# Patient Record
Sex: Male | Born: 1990 | Race: White | Hispanic: No | Marital: Single | Smoking: Current every day smoker
Health system: Southern US, Community
[De-identification: ages and names within clinical notes are randomized; demographics above are authoritative.]

## PROBLEM LIST (undated history)

## (undated) DIAGNOSIS — F410 Panic disorder [episodic paroxysmal anxiety] without agoraphobia: Secondary | ICD-10-CM

## (undated) DIAGNOSIS — F419 Anxiety disorder, unspecified: Secondary | ICD-10-CM

## (undated) HISTORY — PX: HERNIA REPAIR: SHX51

---

## 2016-08-06 ENCOUNTER — Emergency Department (HOSPITAL_COMMUNITY)
Admission: EM | Admit: 2016-08-06 | Discharge: 2016-08-06 | Disposition: A | Discharge status: Home / Self Care | Payer: Managed Care, Other (non HMO) | Attending: Emergency Medicine | Admitting: Emergency Medicine

## 2016-08-06 ENCOUNTER — Emergency Department (HOSPITAL_COMMUNITY): Payer: Managed Care, Other (non HMO)

## 2016-08-06 ENCOUNTER — Encounter (HOSPITAL_COMMUNITY): Payer: Self-pay

## 2016-08-06 DIAGNOSIS — Y9351 Activity, roller skating (inline) and skateboarding: Secondary | ICD-10-CM | POA: Diagnosis not present

## 2016-08-06 DIAGNOSIS — F17200 Nicotine dependence, unspecified, uncomplicated: Secondary | ICD-10-CM | POA: Insufficient documentation

## 2016-08-06 DIAGNOSIS — S161XXA Strain of muscle, fascia and tendon at neck level, initial encounter: Secondary | ICD-10-CM | POA: Insufficient documentation

## 2016-08-06 DIAGNOSIS — Y999 Unspecified external cause status: Secondary | ICD-10-CM | POA: Insufficient documentation

## 2016-08-06 DIAGNOSIS — S199XXA Unspecified injury of neck, initial encounter: Secondary | ICD-10-CM | POA: Diagnosis present

## 2016-08-06 DIAGNOSIS — Y92480 Sidewalk as the place of occurrence of the external cause: Secondary | ICD-10-CM | POA: Insufficient documentation

## 2016-08-06 DIAGNOSIS — F419 Anxiety disorder, unspecified: Secondary | ICD-10-CM | POA: Diagnosis not present

## 2016-08-06 HISTORY — DX: Anxiety disorder, unspecified: F41.9

## 2016-08-06 HISTORY — DX: Panic disorder (episodic paroxysmal anxiety): F41.0

## 2016-08-06 MED ORDER — METHOCARBAMOL 500 MG PO TABS: 500.0000 mg | ORAL_TABLET | Freq: Two times a day (BID) | ORAL | 0 refills | Status: AC

## 2016-08-06 MED ORDER — METHOCARBAMOL 500 MG PO TABS
750.0000 mg | ORAL_TABLET | Freq: Once | ORAL | Status: AC
  Administered 2016-08-06: 750 mg via ORAL
  Filled 2016-08-06: qty 2

## 2016-08-06 MED ORDER — KETOROLAC TROMETHAMINE 60 MG/2ML IM SOLN
30.0000 mg | Freq: Once | INTRAMUSCULAR | Status: DC
  Filled 2016-08-06: qty 2

## 2016-08-06 MED ORDER — IBUPROFEN 600 MG PO TABS: 600.0000 mg | ORAL_TABLET | Freq: Four times a day (QID) | ORAL | 0 refills | Status: AC | PRN

## 2016-08-06 NOTE — ED Provider Notes (Signed)
[facility]-EMERGENCY DEPT Provider Note   CSN: [id number] Arrival date & time: 08/06/16  1303   By signing my name below, I, Benjamin Rose, attest that this documentation has been prepared under the direction and in the presence of Benjamin Rose Derks, PA-C Electronically Signed: Cynda AcresHailei Rose, Scribe. 08/06/16. 1:40 PM.  History   Chief Complaint Chief Complaint  Patient presents with  . Neck Pain  . Referral    HPI Comments: Benjamin Rose is a 25 y.o. male with a PMHx of anxiety, who presents to the Emergency Department complaining of sudden-onset, constant neck pain that began one week ago. Patient states he fell off a skate board onto the sidewalk and hit his head, he has had constant neck pain ever since. Denies headache. Patient states he has been taking ibuprofen (approxiamtely 6 today) with no relief in pain. Patient is also complaining of increased anxiety and panic attacks almost daily. He went to different emergency department. Scarring was given Ativan and Prozac and was told to follow-up. Patient states "Prozac makes me feel funny so stopped it." He is requesting refill on Ativan and requesting outpatient resources for follow-up for his anxiety. He denies SI/HI, numbness, and tingling. Patient has no other complaints at this time.   The history is provided by the patient. No language interpreter was used.    Past Medical History:  Diagnosis Date  . Anxiety   . Panic attack     There are no active problems to display for this patient.   Past Surgical History:  Procedure Laterality Date  . HERNIA REPAIR         Home Medications    Prior to Admission medications   Not on File    Family History History reviewed. No pertinent family history.  Social History Social History  Substance Use Topics  . Smoking status: Current Every Day Smoker  . Smokeless tobacco: Never Used  . Alcohol use No     Allergies   Penicillins   Review of Systems Review of Systems    Musculoskeletal: Positive for neck pain and neck stiffness.  Neurological: Negative for weakness, numbness and headaches.  Psychiatric/Behavioral: Positive for sleep disturbance. Negative for suicidal ideas. The patient is nervous/anxious.   All other systems reviewed and are negative.    Physical Exam Updated Vital Signs BP 136/83 (BP Location: Right Arm)   Pulse 100   Temp 98 F (36.7 C) (Oral)   Resp 16   SpO2 100%   Physical Exam  Constitutional: He appears well-developed and well-nourished. No distress.  HENT:  Head: Normocephalic and atraumatic.  Eyes: Conjunctivae are normal.  Neck: Neck supple.  Midline and bilateral paratubal cervical spine tenderness. Pain with range of motion and in direction of the neck. No deformity, step-offs noted on exam.  Cardiovascular: Normal rate, regular rhythm and normal heart sounds.   Pulmonary/Chest: Effort normal. No respiratory distress. He has no wheezes. He has no rales.  Abdominal: Soft. Bowel sounds are normal. He exhibits no distension. There is no tenderness. There is no rebound.  Musculoskeletal: He exhibits no edema.  Full range of motion of bilateral upper and lower extremities.  Neurological: He is alert.  5/5 and equal bilaeral upper and lower extremity strength. Grip strength 5/5 and equal. Sensation intact in upper and lower extremities.   Skin: Skin is warm and dry.  Multiple circular erythematous lesion to bilateral arms and neck, with central clearing, consistent with ringworm. Patient is already being treated for this.  Nursing note  and vitals reviewed.    ED Treatments / Results  DIAGNOSTIC STUDIES: Oxygen Saturation is 100% on RA, normal by my interpretation.    COORDINATION OF CARE: 1:48 PM Discussed treatment plan with pt at bedside and pt agreed to plan.  Labs (all labs ordered are listed, but only abnormal results are displayed) Labs Reviewed - No data to display  EKG  EKG Interpretation None        Radiology Dg Cervical Spine Complete  Result Date: 08/06/2016 CLINICAL DATA:  Skateboard injury last week. Fell with central neck pain. EXAM: CERVICAL SPINE - COMPLETE 4+ VIEW COMPARISON:  None. FINDINGS: There is no evidence of cervical spine fracture or prevertebral soft tissue swelling. Alignment is normal. No other significant bone abnormalities are identified. IMPRESSION: Normal Electronically Signed   By: Paulina FusiMark  Shogry M.D.   On: 08/06/2016 14:19    Procedures Procedures (including critical care time)  Medications Ordered in ED Medications - No data to display   Initial Impression / Assessment and Plan / ED Course  I have reviewed the triage vital signs and the nursing notes.  Pertinent labs & imaging results that were available during my care of the patient were reviewed by me and considered in my medical decision making (see chart for details).  Clinical Course    Patient in emergency department with persistent neck pain, one week after falling down in hitting his head. He is neurovascularly intact. No pain radiation from the neck. X-rays obtained and are negative. Patient is also complaining of anxiety, with panic attacks. He denies any suicidal or homicidal ideations at this time. I have provided him with a resource guide for follow-up for his anxiety and therapy. I will treat his neck pain with Robaxin and NSAIDs. Follow-up with primary care doctor. Return precautions discussed. Pt refused toradol that was ordered for his pain in ED .  Vitals:   08/06/16 1321  BP: 136/83  Pulse: 100  Resp: 16  Temp: 98 F (36.7 C)  TempSrc: Oral  SpO2: 100%      Final Clinical Impressions(s) / ED Diagnoses   Final diagnoses:  Acute strain of neck muscle, initial encounter  Anxiety    New Prescriptions Discharge Medication List as of 08/06/2016  2:37 PM    START taking these medications   Details  ibuprofen (ADVIL,MOTRIN) 600 MG tablet Take 1 tablet (600 mg total) by  mouth every 6 (six) hours as needed., Starting Wed 08/06/2016, Print    methocarbamol (ROBAXIN) 500 MG tablet Take 1 tablet (500 mg total) by mouth 2 (two) times daily., Starting Wed 08/06/2016, Print        I personally performed the services described in this documentation, which was scribed in my presence. The recorded information has been reviewed and is accurate.     Benjamin Crumbleatyana Janari Gagner, PA-C 08/06/16 1636    Cathren LaineKevin Steinl, MD 08/06/16 2041

## 2016-08-06 NOTE — ED Triage Notes (Signed)
Pt here for 2 things.  Pt states he has been told in past that he has scoliosis.  Pt fell off skateboard x 1 week ago and has had continued neck pain.  Pt also states he was seen a couple of weeks ago for anxiety and panic attacks while out of town.  Was prescribed prozac and ativan.  Pt stopped prozac after 2 weeks bc of how it made him feel.  Has used the minimal amount of ativan that was given. Wants referral to psychiatrist.  Denies SI/HI.

## 2016-08-06 NOTE — Discharge Instructions (Signed)
Ibuprofen for pain. Robaxin for spasms. Follow up with a family doctor. Follow up from resources provided for anxiety treatment.

## 2017-12-02 IMAGING — CR DG CERVICAL SPINE COMPLETE 4+V
6 series · 6 of 6 positions shown · non-contrast
Comparison: None.

CLINICAL DATA: Skateboard injury last week. Fell with central neck
pain.

EXAM:
CERVICAL SPINE - COMPLETE 4+ VIEW

[w cervical spine lat]
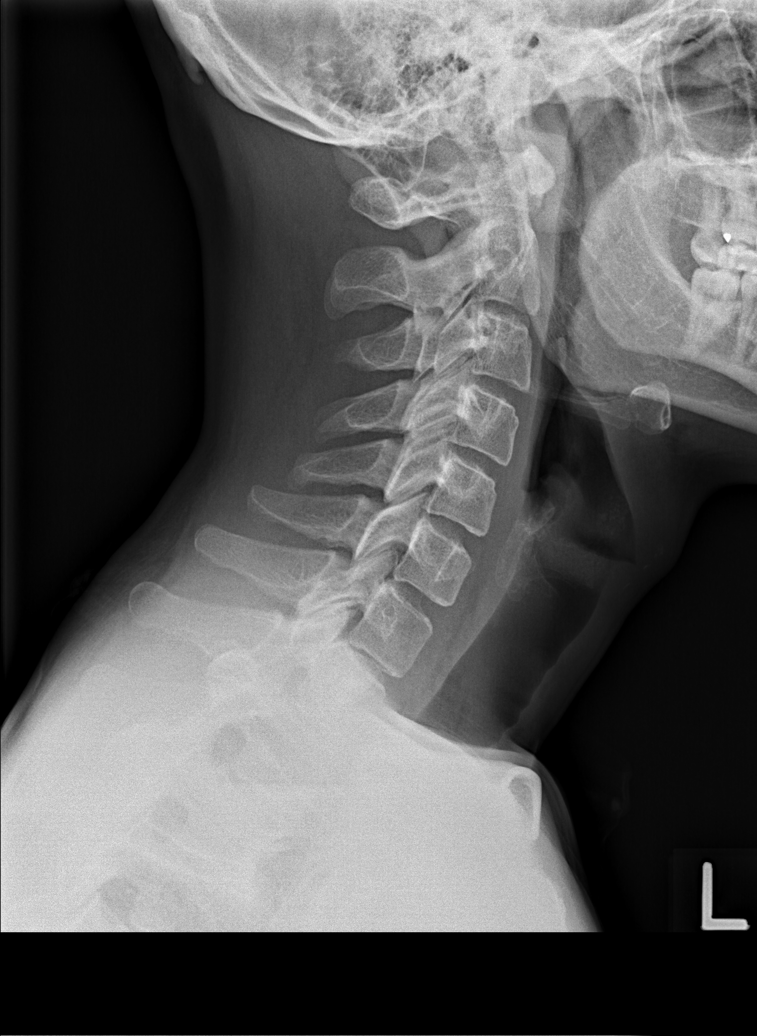

[w cervical spine ap_obl (1 of 2)]
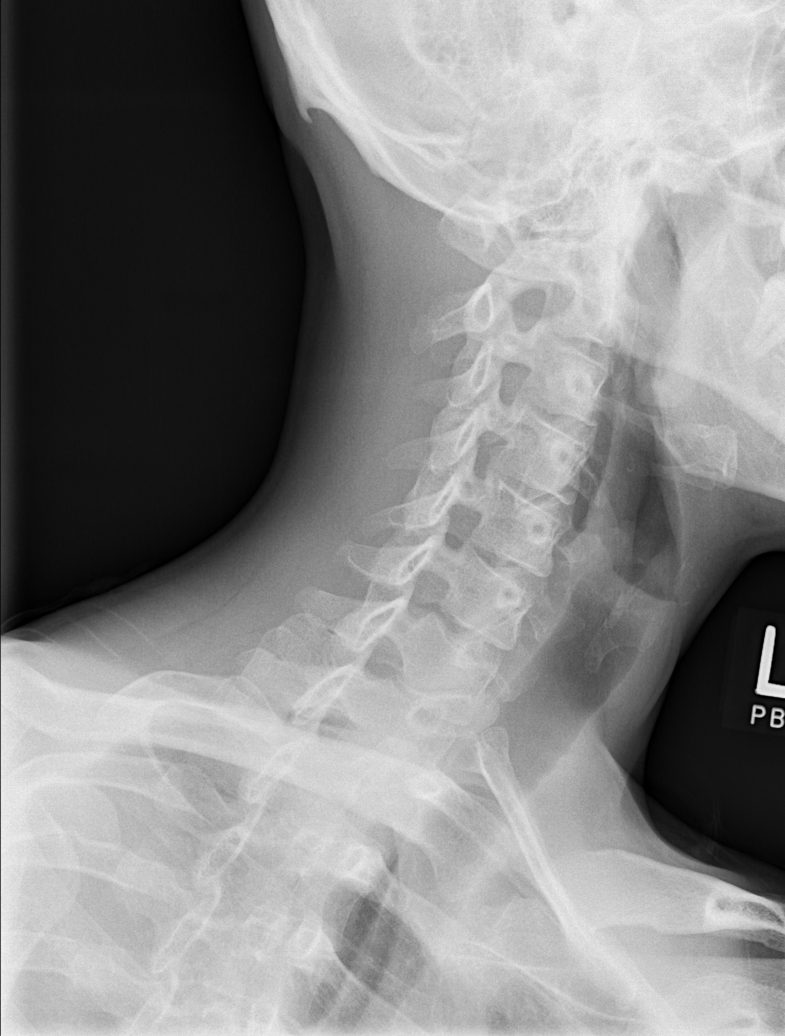

[w cervical spine ap_obl (2 of 2)]
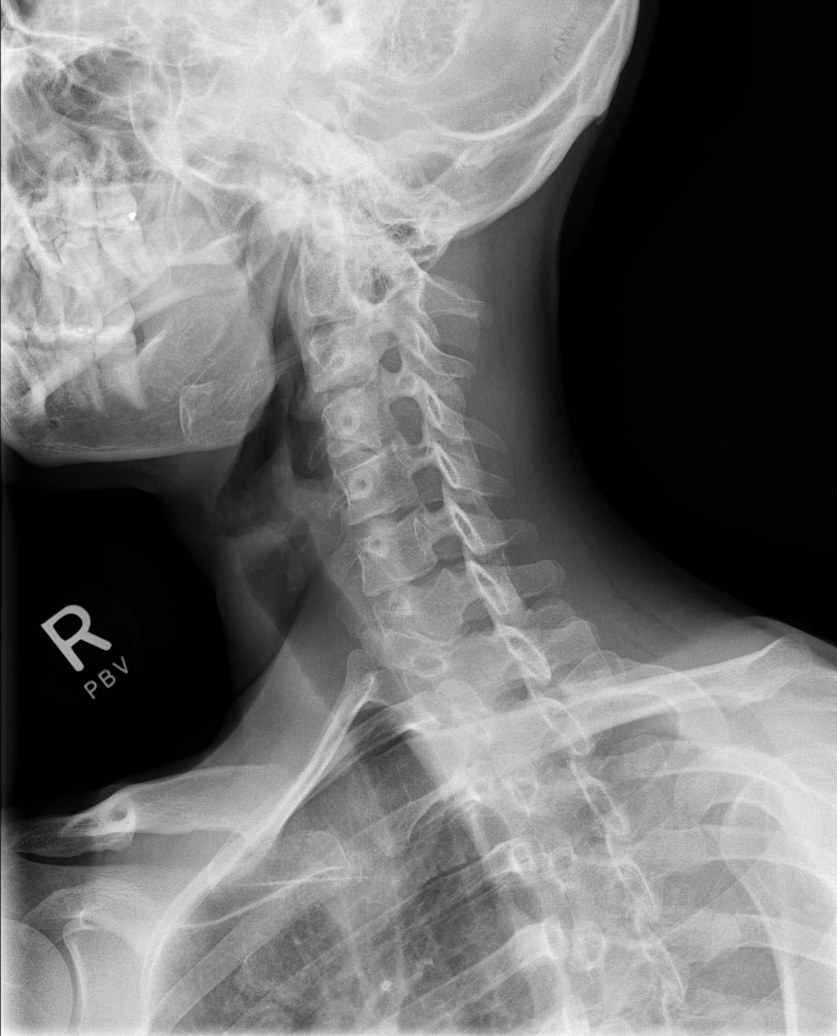

[w cervical spine ap]
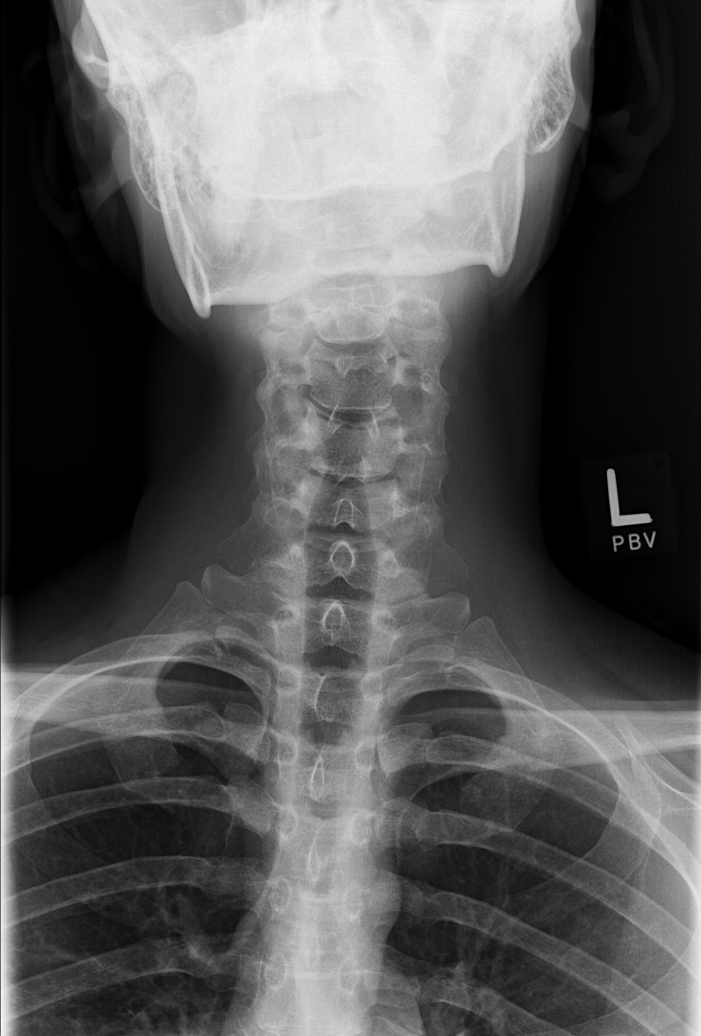

[w cervical spine odontoid]
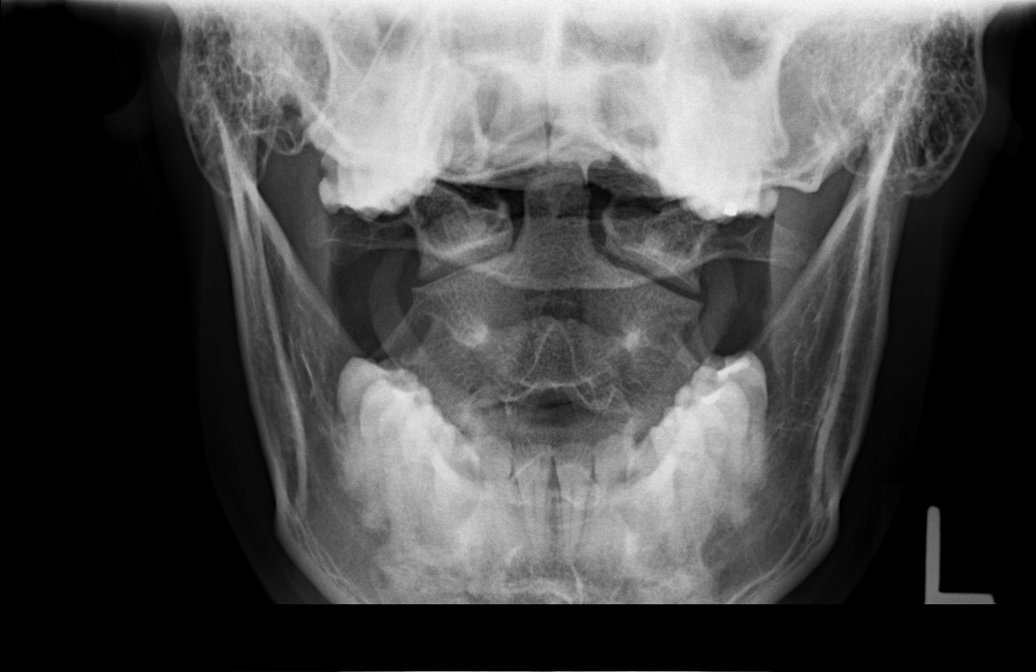

[w cervical swimmers]
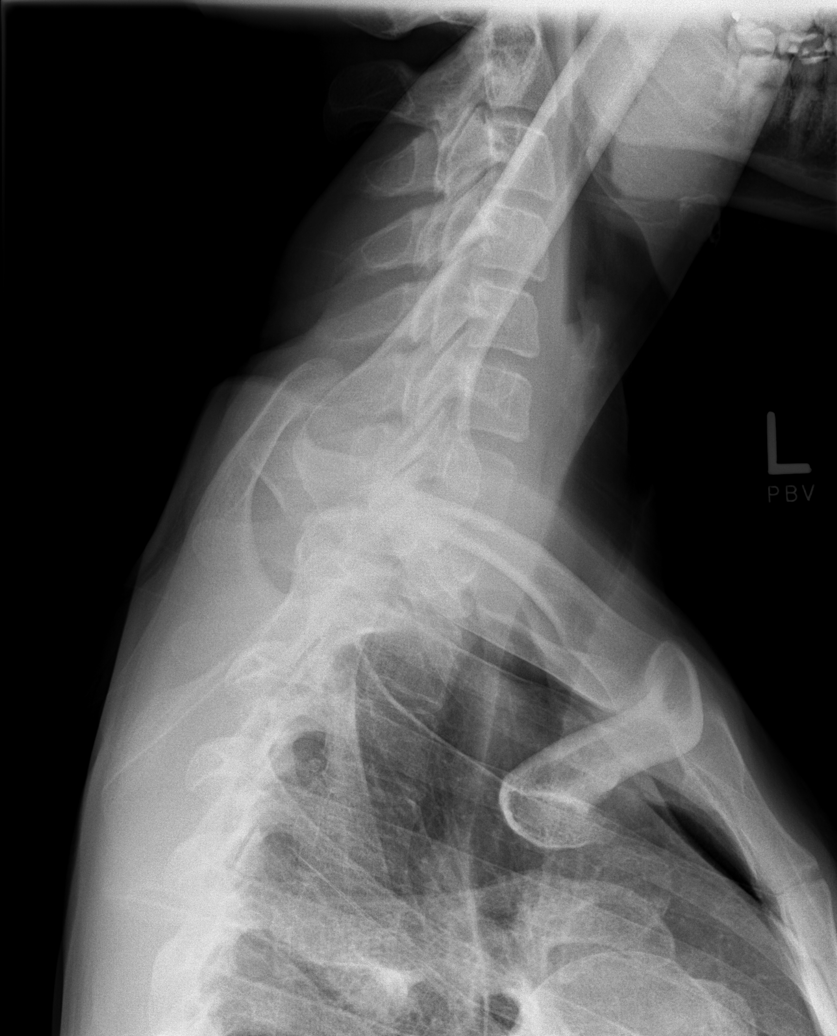

[6 of 6 positions shown; findings below may reference images not displayed]

FINDINGS: There is no evidence of cervical spine fracture or prevertebral soft
tissue swelling. Alignment is normal. No other significant bone
abnormalities are identified.
IMPRESSION: Normal
